# Patient Record
Sex: Male | Born: 1985 | Race: White | Hispanic: No | Marital: Married | Smoking: Never smoker
Health system: Southern US, Community
[De-identification: ages and names within clinical notes are randomized; demographics above are authoritative.]

## PROBLEM LIST (undated history)

## (undated) DIAGNOSIS — E663 Overweight: Secondary | ICD-10-CM

## (undated) DIAGNOSIS — Z8619 Personal history of other infectious and parasitic diseases: Secondary | ICD-10-CM

## (undated) DIAGNOSIS — J302 Other seasonal allergic rhinitis: Secondary | ICD-10-CM

## (undated) HISTORY — DX: Other seasonal allergic rhinitis: J30.2

## (undated) HISTORY — PX: OTHER SURGICAL HISTORY: SHX169

## (undated) HISTORY — DX: Overweight: E66.3

## (undated) HISTORY — DX: Personal history of other infectious and parasitic diseases: Z86.19

---

## 2005-01-07 ENCOUNTER — Emergency Department: Payer: Self-pay | Admitting: Emergency Medicine

## 2007-05-09 IMAGING — CT CT HEAD WITHOUT CONTRAST
2 series · 16 of 30 positions shown, 20 images · non-contrast
Comparison: none

REASON FOR EXAM: injury/ pain   rm #1
COMMENTS:

PROCEDURE:     CT  - CT HEAD WITHOUT CONTRAST  - January 08, 2005 [DATE]
RESULT:     No intraaxial or extraaxial pathologic fluid or blood
collections identified.  No mass lesions or hydrocephalus noted.  LEFT
parietal scalp swelling is present.  No underlying fracture present.

[Series 2: without · axial · non-contrast · 0.46mm/px · z∈[-128,-8]mm · 13 of 29 slices shown, 17 images]
[im 3/29  brain]
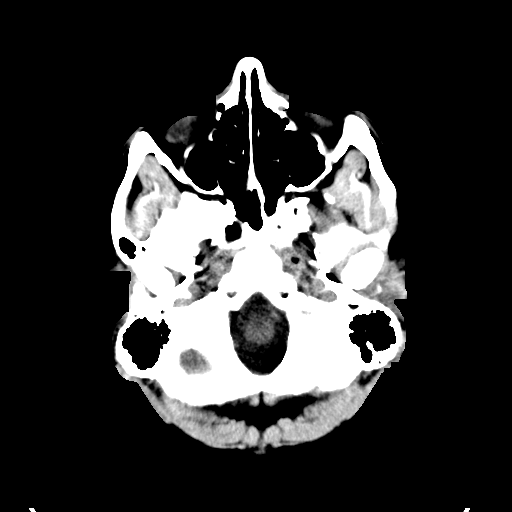
[im 3/29  bone]
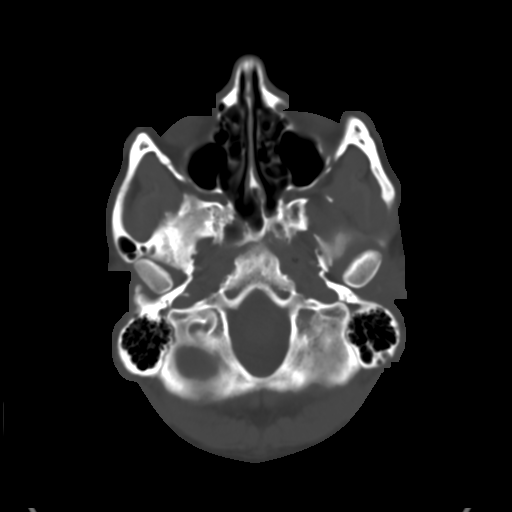
[im 5/29  brain]
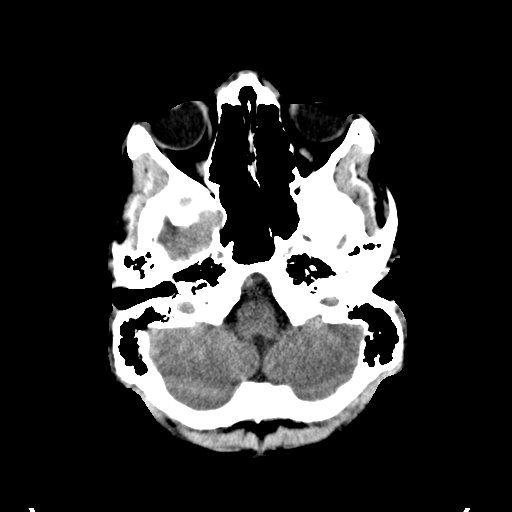
[im 7/29  brain]
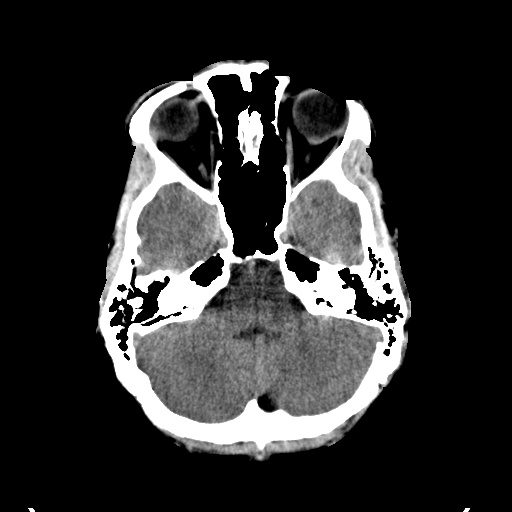
[im 9/29  brain]
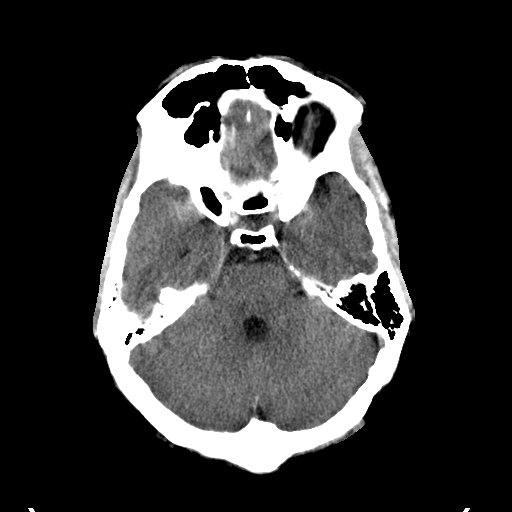
[im 11/29  brain]
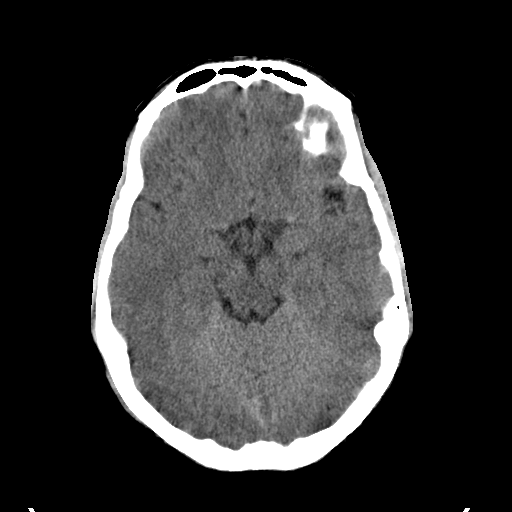
[im 11/29  bone]
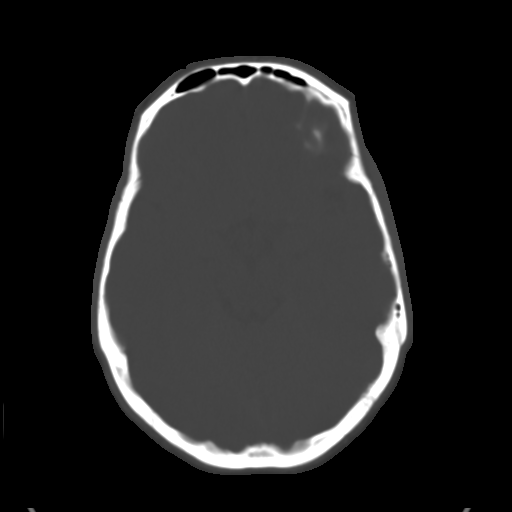
[im 13/29  brain]
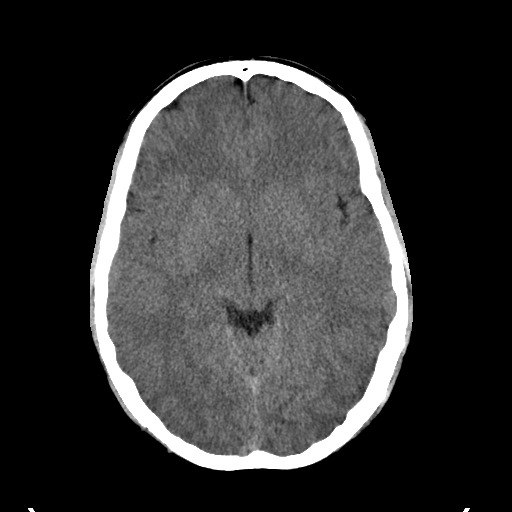
[im 15/29  brain]
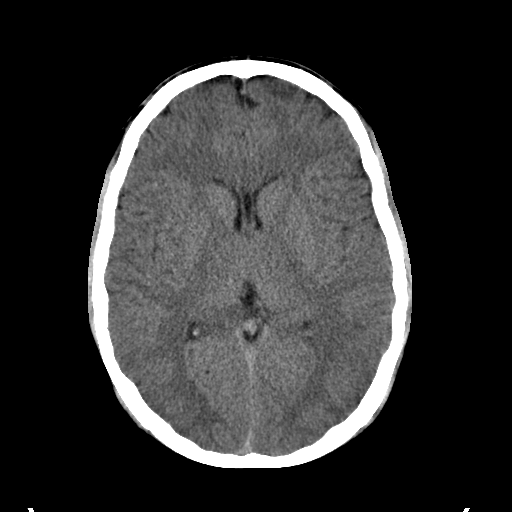
[im 17/29  brain]
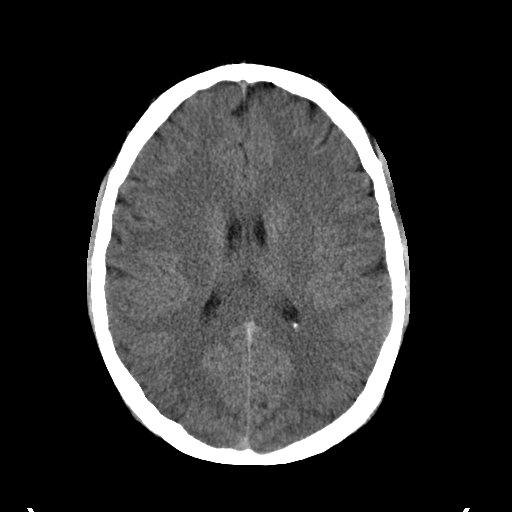
[im 19/29  brain]
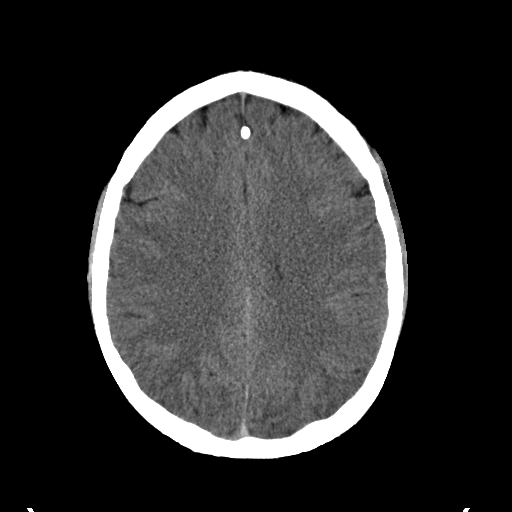
[im 19/29  bone]
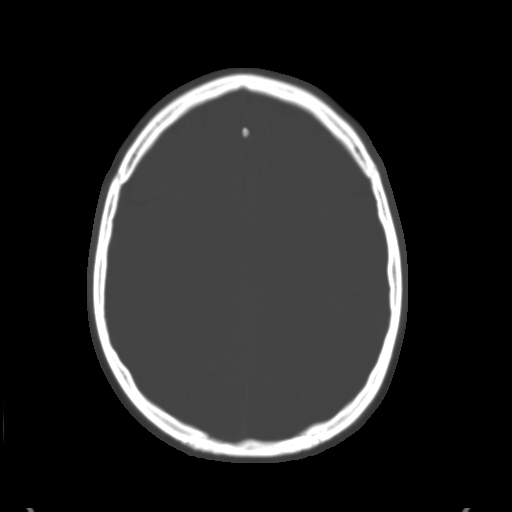
[im 21/29  brain]
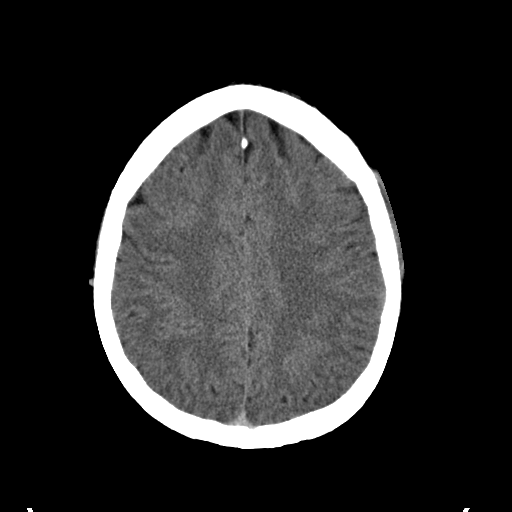
[im 23/29  brain]
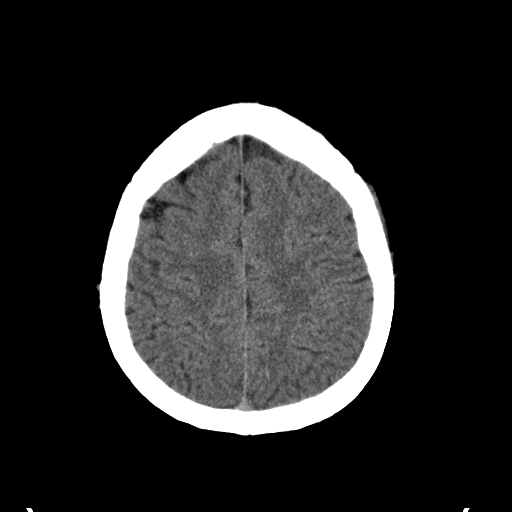
[im 25/29  brain]
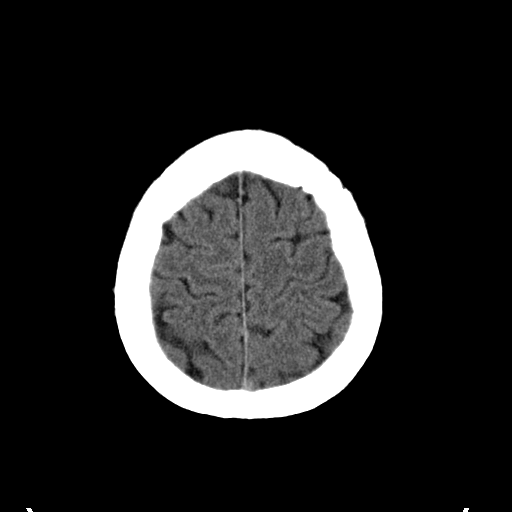
[im 27/29  brain]
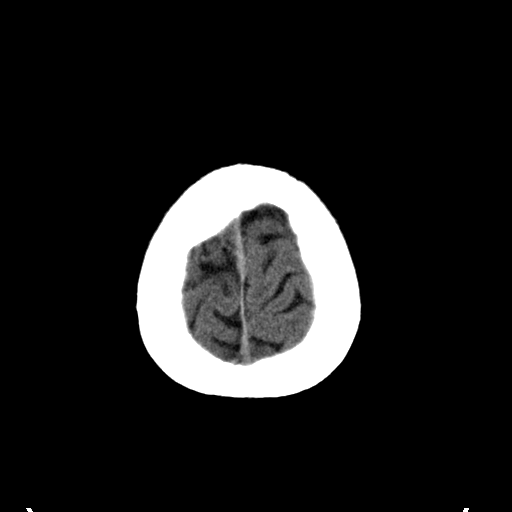
[im 27/29  bone]
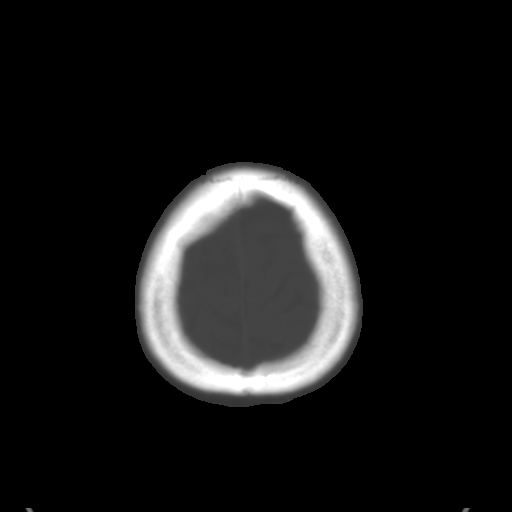

[Series 3: bone · axial · 0.46mm/px · z∈[-128,-88]mm · 3 of 29 slices shown]
[im 3/29  bone]
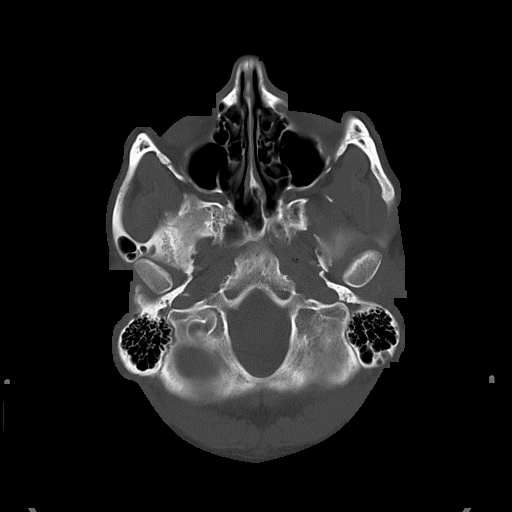
[im 7/29  bone]
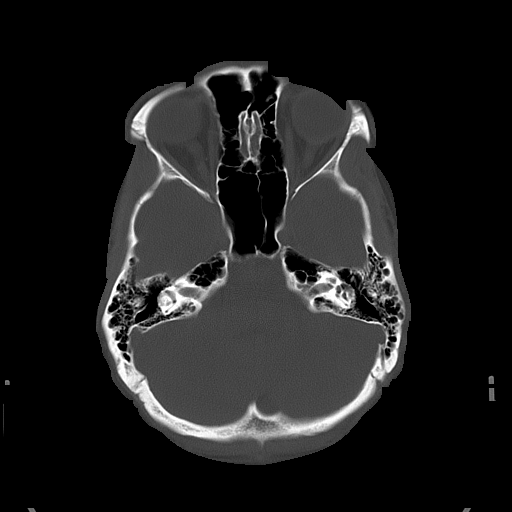
[im 11/29  bone]
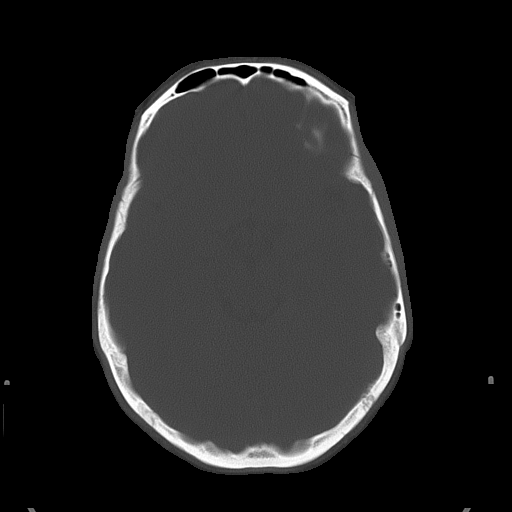

[16 of 30 positions shown; findings below may reference images not displayed]

IMPRESSION: LEFT parietal scalp swelling.  No underlying fracture.  No acute
intracranial abnormalities identified.  This report was faxed to the
emergency room at the time of the study.

## 2007-05-09 IMAGING — CR DG WRIST COMPLETE 3+V*R*
1 series · 5 of 5 positions shown · non-contrast
Comparison: none

REASON FOR EXAM: injury/ pain    rm #1
COMMENTS:

[Series 1: view not recorded · 0.17mm/px · 5 of 5 slices shown]
[im 1/5]
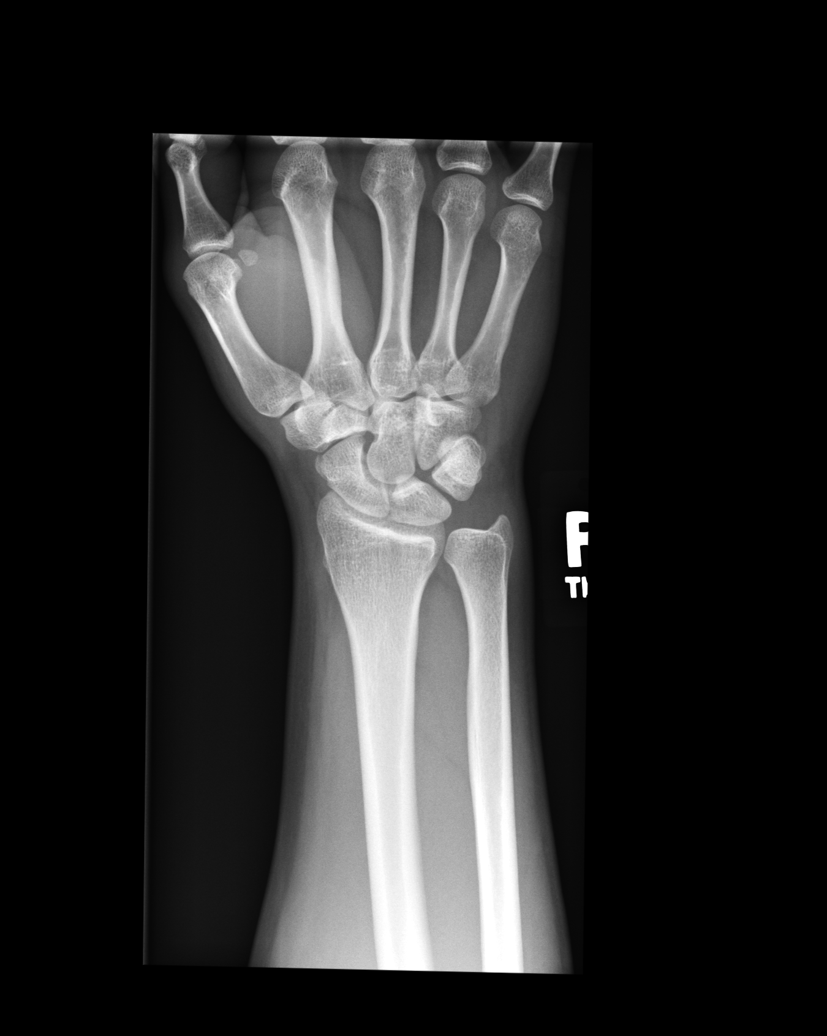
[im 2/5]
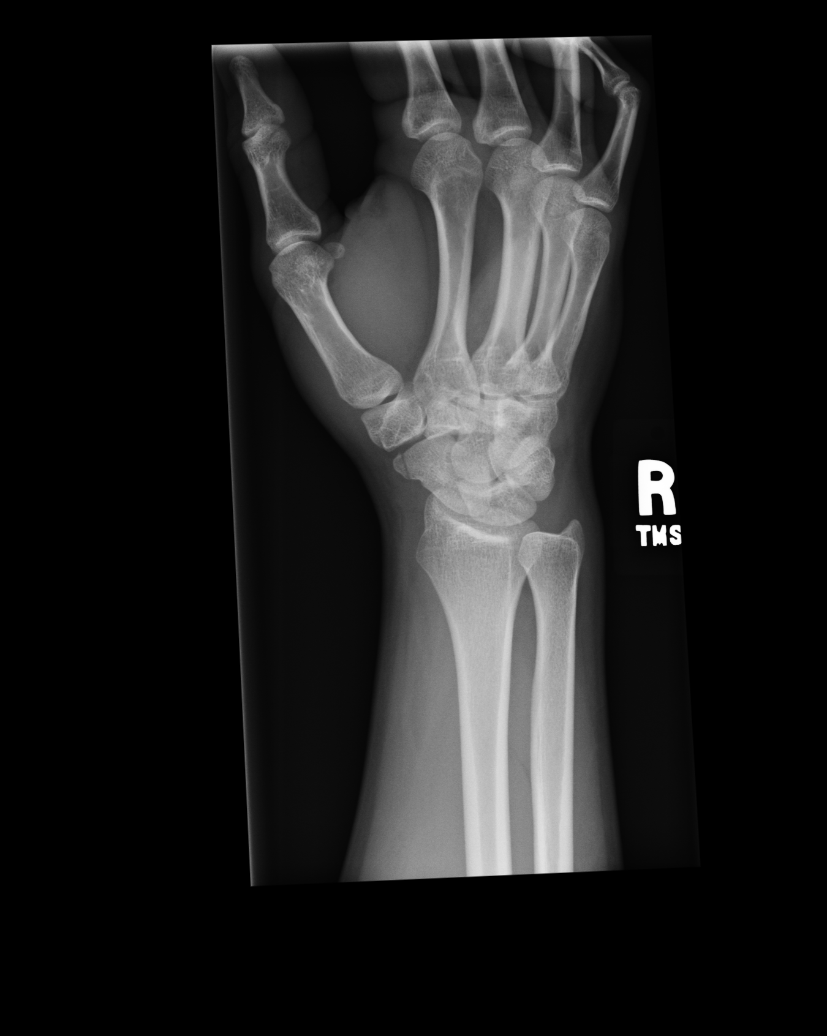
[im 3/5]
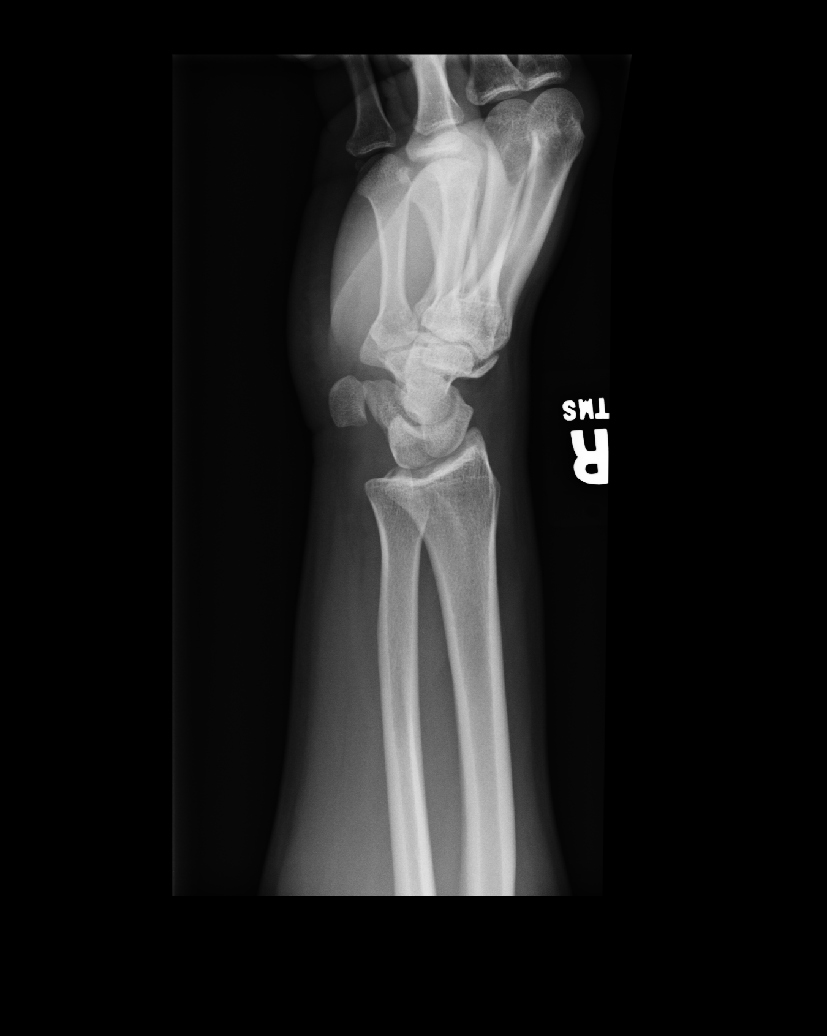
[im 4/5]
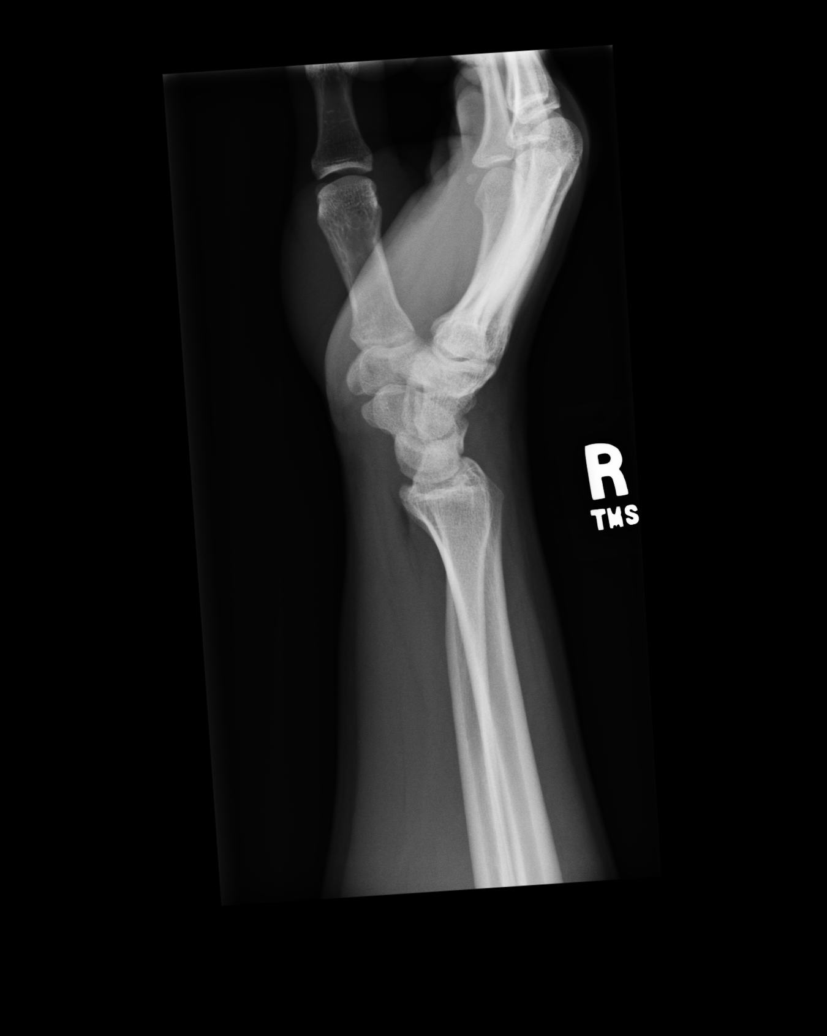
[im 5/5]
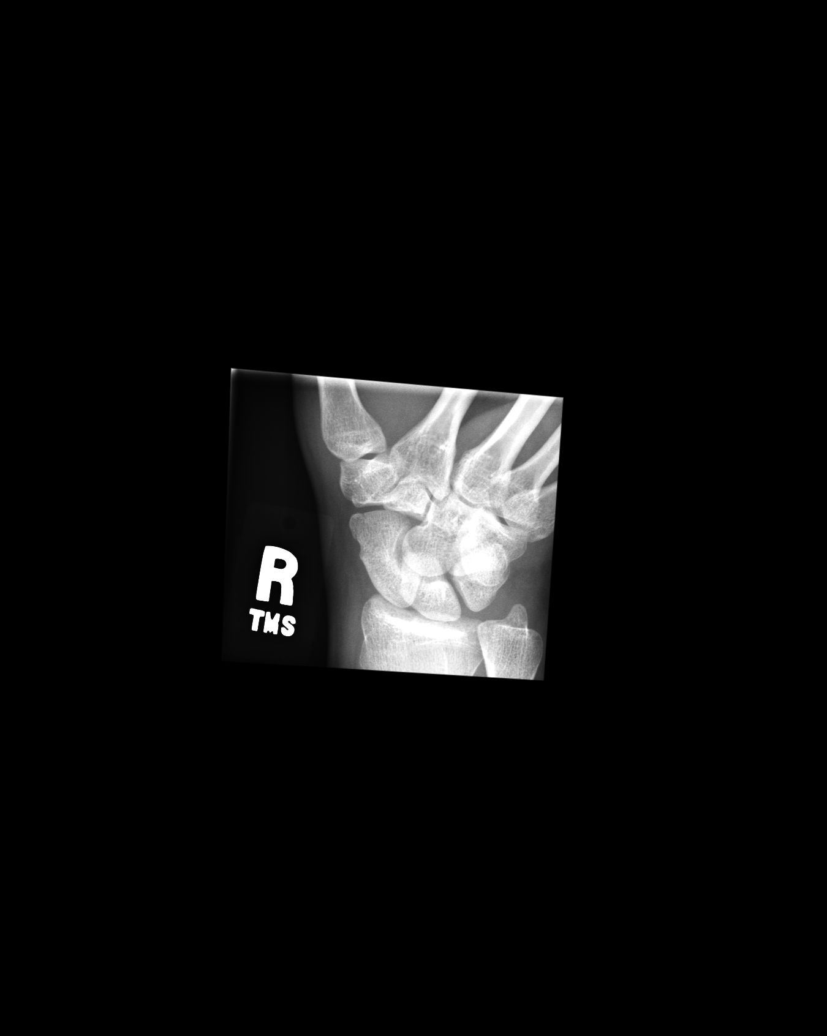

[5 of 5 positions shown; findings below may reference images not displayed]

PROCEDURE:     DXR - DXR WRIST RT COMP WITH OBLIQUES  - January 08, 2005 [DATE]

RESULT:     Multiple views of the RIGHT wrist demonstrate a non-displaced
fracture about the distal lateral aspect of the navicular that appears to be
distal to and does not involve the navicular waist.  No other abnormality is
evident.
IMPRESSION: 1)Please see above.

## 2010-05-29 ENCOUNTER — Encounter: Payer: Self-pay | Admitting: Family Medicine

## 2010-05-29 ENCOUNTER — Ambulatory Visit (INDEPENDENT_AMBULATORY_CARE_PROVIDER_SITE_OTHER): Payer: BC Managed Care – PPO | Admitting: Family Medicine

## 2010-05-29 VITALS — BP 122/78 | HR 72 | Temp 98.6°F | Ht 72.0 in | Wt 206.0 lb

## 2010-05-29 DIAGNOSIS — Z0000 Encounter for general adult medical examination without abnormal findings: Secondary | ICD-10-CM | POA: Insufficient documentation

## 2010-05-29 DIAGNOSIS — E663 Overweight: Secondary | ICD-10-CM

## 2010-05-29 DIAGNOSIS — J302 Other seasonal allergic rhinitis: Secondary | ICD-10-CM | POA: Insufficient documentation

## 2010-05-29 DIAGNOSIS — J309 Allergic rhinitis, unspecified: Secondary | ICD-10-CM

## 2010-05-29 NOTE — Assessment & Plan Note (Signed)
rec trial of nasal saline irrigation

## 2010-05-29 NOTE — Patient Instructions (Signed)
Check on last tetanus and see if also received pertussis component.  If not, call us to set up shot. Return this week fasting for blood work. Return in 1-2 years for next physical or as needed. Good to see you today, call clinic with questions. Try nasal saline irrigation for allergies, if not improved let me know.

## 2010-05-29 NOTE — Assessment & Plan Note (Signed)
Check FLP, CMP.  BMI 27.   To check on last tetanus and if had pertussis.  Will call us if needs updated.  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 Declines STD screen.

## 2010-05-29 NOTE — Assessment & Plan Note (Signed)
Discussed healthy activity level, states starting to exercise more with wife.   Check blood work (FLP, CMP) when returns fasting.

## 2010-05-29 NOTE — Progress Notes (Signed)
  Subjective:    Patient ID: Jorge Espinoza, male    DOB: August 19, 1985, 25 y.o.   MRN: [id number]  HPI CC: new patient, no concerns  Just got out of [employer] (11/2009, finished 12/2009).  No sick contacts at home.  Getting over possible viral URTI with congestion and ST, also with h/o allergies, worse this year.  no fever.  Son (1yo) staying at home.  Preventative: Unsure last tetanus. Blood work done last year.  Medications and allergies reviewed and updated as above. PMHx reviewed as well as SHx, FmHx, and surg hx and updated as in chart.  Review of Systems  Constitutional: Negative for fever, chills, activity change, appetite change, fatigue and unexpected weight change.  HENT: Negative for hearing loss and neck pain.   Eyes: Negative for visual disturbance.  Respiratory: Negative for cough, chest tightness, shortness of breath and wheezing.   Cardiovascular: Negative for chest pain, palpitations and leg swelling.  Gastrointestinal: Negative for nausea, vomiting, abdominal pain, diarrhea, constipation, blood in stool and abdominal distention.  Genitourinary: Negative for hematuria and difficulty urinating.  Musculoskeletal: Negative for myalgias and arthralgias.  Skin: Negative for rash.  Neurological: Negative for dizziness, seizures, syncope and headaches.  Psychiatric/Behavioral: Negative for dysphoric mood. The patient is not nervous/anxious.        Objective:   Physical Exam  Nursing note and vitals reviewed. Constitutional: He is oriented to person, place, and time. He appears well-developed and well-nourished.  HENT:  Head: Normocephalic and atraumatic.  Right Ear: External ear normal.  Left Ear: External ear normal.  Nose: Nose normal.  Mouth/Throat: Oropharynx is clear and moist.  Eyes: Conjunctivae and EOM are normal. Pupils are equal, round, and reactive to light.  Neck: Normal range of motion. Neck supple. No thyromegaly present.  Cardiovascular: Normal rate, regular  rhythm, normal heart sounds and intact distal pulses.   Pulses:      Radial pulses are 2+ on the right side, and 2+ on the left side.  Pulmonary/Chest: Effort normal and breath sounds normal. He has no wheezes. He has no rales.  Abdominal: Soft. Bowel sounds are normal. He exhibits no distension and no mass. There is no tenderness. There is no rebound and no guarding.  Musculoskeletal: Normal range of motion.  Lymphadenopathy:    He has no cervical adenopathy.  Neurological: He is alert and oriented to person, place, and time.       CN grossly intact, station and gait intact  Skin: Skin is warm and dry.  Psychiatric: He has a normal mood and affect. His behavior is normal. Judgment and thought content normal.          Assessment & Plan:

## 2010-06-01 ENCOUNTER — Other Ambulatory Visit (INDEPENDENT_AMBULATORY_CARE_PROVIDER_SITE_OTHER): Payer: BC Managed Care – PPO | Admitting: Family Medicine

## 2010-06-01 DIAGNOSIS — E663 Overweight: Secondary | ICD-10-CM

## 2010-06-01 LAB — LIPID PANEL
HDL: 38.5 mg/dL — ABNORMAL LOW (ref >39.00)
Total CHOL/HDL Ratio: 4
VLDL: 13 mg/dL (ref 0.0–40.0)

## 2010-06-01 LAB — COMPREHENSIVE METABOLIC PANEL
ALT: 24 U/L (ref 0–53)
AST: 17 U/L (ref 0–37)
Calcium: 9.1 mg/dL (ref 8.4–10.5)
Chloride: 101 mEq/L (ref 96–112)
Creatinine, Ser: 1.2 mg/dL (ref 0.4–1.5)
Sodium: 139 mEq/L (ref 135–145)

## 2014-09-30 ENCOUNTER — Ambulatory Visit: Payer: BC Managed Care – PPO | Admitting: Family Medicine

## 2020-11-22 ENCOUNTER — Ambulatory Visit
Admission: EM | Admit: 2020-11-22 | Discharge: 2020-11-22 | Disposition: A | Discharge status: Home / Self Care | Payer: Non-veteran care | Attending: Emergency Medicine | Admitting: Emergency Medicine

## 2020-11-22 ENCOUNTER — Other Ambulatory Visit: Payer: Self-pay

## 2020-11-22 ENCOUNTER — Encounter: Payer: Self-pay | Admitting: Emergency Medicine

## 2020-11-22 DIAGNOSIS — R030 Elevated blood-pressure reading, without diagnosis of hypertension: Secondary | ICD-10-CM

## 2020-11-22 DIAGNOSIS — M5441 Lumbago with sciatica, right side: Secondary | ICD-10-CM | POA: Diagnosis not present

## 2020-11-22 MED ORDER — CYCLOBENZAPRINE HCL 10 MG PO TABS: 10.0000 mg | ORAL_TABLET | Freq: Two times a day (BID) | ORAL | 0 refills | Status: AC | PRN

## 2020-11-22 NOTE — ED Provider Notes (Signed)
Jorge Espinoza    CSN: [id number] Arrival date & time: 11/22/20  1034      History   Chief Complaint Chief Complaint  Patient presents with   Back Pain     HPI Jorge Espinoza is a 35 y.o. male.  Patient presents with right lower back pain since yesterday.  The pain started after he was moving heavy furniture.  It radiates to his right buttock.  The pain is worse with position changes and sitting for prolonged periods.  It improves with standing and lying flat.  He denies numbness, weakness, saddle anesthesia, loss of bowel/bladder control, abdominal pain, dysuria, hematuria, or other symptoms.  Treatment at home with Tylenol.  He reports history of previous muscle strain in his back intermittently in the past.  The history is provided by the patient.   Past Medical History:  Diagnosis Date   History of chicken pox    Overweight(278.02)    BMI 27   Seasonal allergies     Patient Active Problem List   Diagnosis Date Noted   Healthcare maintenance 05/29/2010   Seasonal allergies    Overweight(278.02)     Past Surgical History:  Procedure Laterality Date   OTHER SURGICAL HISTORY     R wrist fracture, no surgery needed       Home Medications    Prior to Admission medications   Medication Sig Start Date End Date Taking? Authorizing Provider  cyclobenzaprine (FLEXERIL) 10 MG tablet Take 1 tablet (10 mg total) by mouth 2 (two) times daily as needed for muscle spasms. 11/22/20  Yes Mickie Bail, NP    Family History Family History  Problem Relation Age of Onset   Cancer Neg Hx    Diabetes Neg Hx    Stroke Neg Hx    Coronary artery disease Neg Hx     Social History Social History   Tobacco Use   Smoking status: Never   Smokeless tobacco: Never  Substance Use Topics   Alcohol use: Yes    Comment: Occasional, social   Drug use: No     Allergies   Patient has no known allergies.   Review of Systems Review of Systems  Constitutional:  Negative  for chills and fever.  Respiratory:  Negative for cough and shortness of breath.   Cardiovascular:  Negative for chest pain and palpitations.  Gastrointestinal:  Negative for abdominal pain and vomiting.  Genitourinary:  Negative for dysuria and hematuria.  Musculoskeletal:  Positive for back pain. Negative for gait problem.  Skin:  Negative for color change, rash and wound.  Neurological:  Negative for weakness and numbness.  All other systems reviewed and are negative.   Physical Exam Triage Vital Signs ED Triage Vitals  Enc Vitals Group     BP      Pulse      Resp      Temp      Temp src      SpO2      Weight      Height      Head Circumference      Peak Flow      Pain Score      Pain Loc      Pain Edu?      Excl. in GC?    No data found.  Updated Vital Signs BP (!) 150/98 (BP Location: Left Arm)   Pulse 85   Temp 98.1 F (36.7 C) (Oral)   Resp 18  SpO2 97%   Visual Acuity Right Eye Distance:   Left Eye Distance:   Bilateral Distance:    Right Eye Near:   Left Eye Near:    Bilateral Near:     Physical Exam Vitals and nursing note reviewed.  Constitutional:      General: He is not in acute distress.    Appearance: He is well-developed.  HENT:     Head: Normocephalic and atraumatic.     Mouth/Throat:     Mouth: Mucous membranes are moist.  Eyes:     Conjunctiva/sclera: Conjunctivae normal.  Cardiovascular:     Rate and Rhythm: Normal rate and regular rhythm.     Heart sounds: Normal heart sounds.  Pulmonary:     Effort: Pulmonary effort is normal. No respiratory distress.     Breath sounds: Normal breath sounds.  Abdominal:     Palpations: Abdomen is soft.     Tenderness: There is no abdominal tenderness. There is no right CVA tenderness, left CVA tenderness, guarding or rebound.  Musculoskeletal:        General: Tenderness present. No swelling, deformity or signs of injury. Normal range of motion.       Arms:     Cervical back: Neck supple.   Skin:    General: Skin is warm and dry.     Capillary Refill: Capillary refill takes less than 2 seconds.     Findings: No bruising, erythema, lesion or rash.  Neurological:     General: No focal deficit present.     Mental Status: He is alert and oriented to person, place, and time.     Sensory: No sensory deficit.     Motor: No weakness.     Gait: Gait normal.  Psychiatric:        Mood and Affect: Mood normal.        Behavior: Behavior normal.     UC Treatments / Results  Labs (all labs ordered are listed, but only abnormal results are displayed) Labs Reviewed - No data to display  EKG   Radiology No results found.  Procedures Procedures (including critical care time)  Medications Ordered in UC Medications - No data to display  Initial Impression / Assessment and Plan / UC Course  I have reviewed the triage vital signs and the nursing notes.  Pertinent labs & imaging results that were available during my care of the patient were reviewed by me and considered in my medical decision making (see chart for details).  Acute right lower back pain with right sciatica.  Elevated blood pressure reading.  Treating with cyclobenzaprine; precautions for drowsiness with this medication discussed.  Instructed patient to take ibuprofen also as needed for discomfort.  Instructed him to follow-up with his PCP or an orthopedist if his symptoms are not improving.  Also discussed that his blood pressure is elevated today and needs to be rechecked by his PCP in 2 to 4 weeks.  He agrees to plan of care.   Final Clinical Impressions(s) / UC Diagnoses   Final diagnoses:  Acute right-sided low back pain with right-sided sciatica  Elevated blood pressure reading     Discharge Instructions      Take ibuprofen as needed for discomfort.  Take the muscle relaxer as needed for muscle spasm; Do not drive, operate machinery, or drink alcohol with this medication as it can cause drowsiness.    Follow up with your primary care provider or an orthopedist if your symptoms are not  improving.    Your blood pressure is elevated today at 150/98.  Please have this rechecked by your primary care provider in 2-4 weeks.          ED Prescriptions     Medication Sig Dispense Auth. Provider   cyclobenzaprine (FLEXERIL) 10 MG tablet Take 1 tablet (10 mg total) by mouth 2 (two) times daily as needed for muscle spasms. 20 tablet Mickie Bail, NP      I have reviewed the PDMP during this encounter.   Mickie Bail, NP 11/22/20 1114

## 2020-11-22 NOTE — Discharge Instructions (Addendum)
Take ibuprofen as needed for discomfort.  Take the muscle relaxer as needed for muscle spasm; Do not drive, operate machinery, or drink alcohol with this medication as it can cause drowsiness.   Follow up with your primary care provider or an orthopedist if your symptoms are not improving.    Your blood pressure is elevated today at 150/98.  Please have this rechecked by your primary care provider in 2-4 weeks.

## 2020-11-22 NOTE — ED Triage Notes (Signed)
Pt here with right lumbar back pain after moving furniture yesterday. Radiation to right buttock
